# Patient Record
Sex: Female | Born: 1965 | Race: White | Hispanic: No | Marital: Married | Smoking: Former smoker
Health system: Southern US, Community
[De-identification: ages and names within clinical notes are randomized; demographics above are authoritative.]

## PROBLEM LIST (undated history)

## (undated) DIAGNOSIS — F32A Depression, unspecified: Secondary | ICD-10-CM

## (undated) DIAGNOSIS — F329 Major depressive disorder, single episode, unspecified: Secondary | ICD-10-CM

## (undated) DIAGNOSIS — F419 Anxiety disorder, unspecified: Secondary | ICD-10-CM

## (undated) DIAGNOSIS — G43909 Migraine, unspecified, not intractable, without status migrainosus: Secondary | ICD-10-CM

## (undated) HISTORY — PX: CHOLECYSTECTOMY: SHX55

---

## 2005-04-25 ENCOUNTER — Emergency Department: Payer: Self-pay | Admitting: Emergency Medicine

## 2005-04-27 ENCOUNTER — Emergency Department: Payer: Self-pay | Admitting: Emergency Medicine

## 2005-04-27 ENCOUNTER — Other Ambulatory Visit: Payer: Self-pay

## 2007-06-08 ENCOUNTER — Ambulatory Visit: Payer: Self-pay | Admitting: Internal Medicine

## 2009-12-02 ENCOUNTER — Ambulatory Visit: Payer: Self-pay | Admitting: Family Medicine

## 2012-03-08 ENCOUNTER — Emergency Department: Payer: Self-pay | Admitting: Emergency Medicine

## 2012-03-08 LAB — COMPREHENSIVE METABOLIC PANEL
Alkaline Phosphatase: 69 U/L (ref 50–136)
Anion Gap: 6 — ABNORMAL LOW (ref 7–16)
Bilirubin,Total: 0.4 mg/dL (ref 0.2–1.0)
Calcium, Total: 9.4 mg/dL (ref 8.5–10.1)
Co2: 25 mmol/L (ref 21–32)
Glucose: 100 mg/dL — ABNORMAL HIGH (ref 65–99)
Osmolality: 273 (ref 275–301)
SGOT(AST): 15 U/L (ref 15–37)
SGPT (ALT): 28 U/L (ref 12–78)
Sodium: 137 mmol/L (ref 136–145)
Total Protein: 7.8 g/dL (ref 6.4–8.2)

## 2012-03-08 LAB — CBC
HCT: 41.9 % (ref 35.0–47.0)
HGB: 14.5 g/dL (ref 12.0–16.0)
MCH: 28.7 pg (ref 26.0–34.0)
MCHC: 34.6 g/dL (ref 32.0–36.0)
MCV: 83 fL (ref 80–100)
RDW: 13.3 % (ref 11.5–14.5)
WBC: 12.2 10*3/uL — ABNORMAL HIGH (ref 3.6–11.0)

## 2014-05-10 ENCOUNTER — Ambulatory Visit: Payer: Self-pay | Admitting: Family Medicine

## 2015-04-06 ENCOUNTER — Ambulatory Visit
Admission: EM | Admit: 2015-04-06 | Discharge: 2015-04-06 | Disposition: A | Discharge status: Home / Self Care | Payer: 59 | Attending: Family Medicine | Admitting: Family Medicine

## 2015-04-06 ENCOUNTER — Encounter: Payer: Self-pay | Admitting: Emergency Medicine

## 2015-04-06 DIAGNOSIS — J0100 Acute maxillary sinusitis, unspecified: Secondary | ICD-10-CM | POA: Diagnosis not present

## 2015-04-06 DIAGNOSIS — J040 Acute laryngitis: Secondary | ICD-10-CM | POA: Diagnosis not present

## 2015-04-06 HISTORY — DX: Migraine, unspecified, not intractable, without status migrainosus: G43.909

## 2015-04-06 HISTORY — DX: Anxiety disorder, unspecified: F41.9

## 2015-04-06 HISTORY — DX: Major depressive disorder, single episode, unspecified: F32.9

## 2015-04-06 HISTORY — DX: Depression, unspecified: F32.A

## 2015-04-06 MED ORDER — CEFUROXIME AXETIL 500 MG PO TABS: 500.0000 mg | ORAL_TABLET | Freq: Two times a day (BID) | ORAL | Status: AC

## 2015-04-06 MED ORDER — FEXOFENADINE-PSEUDOEPHED ER 180-240 MG PO TB24: 1.0000 | ORAL_TABLET | Freq: Every day | ORAL | Status: AC

## 2015-04-06 MED ORDER — FLUTICASONE PROPIONATE 50 MCG/ACT NA SUSP: 2.0000 | Freq: Every day | NASAL | Status: AC

## 2015-04-06 NOTE — ED Notes (Signed)
Patient c/o sinus pain and pressure, cough, runny nose and nasal congestion since Monday.

## 2015-04-06 NOTE — Discharge Instructions (Signed)
Laryngitis Laryngitis is swelling (inflammation) of your vocal cords. This causes hoarseness, coughing, loss of voice, sore throat, or a dry throat. When your vocal cords are inflamed, your voice sounds different. Laryngitis can be temporary (acute) or long-term (chronic). Most cases of acute laryngitis improve with time. Chronic laryngitis is laryngitis that lasts for more than three weeks. HOME CARE  Drink enough fluid to keep your pee (urine) clear or pale yellow.  Breathe in moist air. Use a humidifier if you live in a dry climate.  Take medicines only as told by your doctor.  Do not smoke cigarettes or electronic cigarettes. If you need help quitting, ask your doctor.  Talk as little as possible. Also avoid whispering, which can cause vocal strain.  Write instead of talking. Do this until your voice is back to normal. GET HELP IF:  You have a fever.  Your pain is worse.  You have trouble swallowing. GET HELP RIGHT AWAY IF:  You cough up blood.  You have trouble breathing.   This information is not intended to replace advice given to you by your health care provider. Make sure you discuss any questions you have with your health care provider.   Document Released: 03/14/2011 Document Revised: 04/15/2014 Document Reviewed: 09/07/2013 Elsevier Interactive Patient Education 2016 Elsevier Inc.  Sinus Rinse WHAT IS A SINUS RINSE? A sinus rinse is a home treatment. It rinses your sinuses with a mixture of salt and water (saline solution). Sinuses are air-filled spaces in your skull behind the bones of your face and forehead. They open into your nasal cavity. To do a sinus rinse, you will need:  Saline solution.  Neti pot or spray bottle. This releases the saline solution into your nose and through your sinuses. You can buy neti pots and spray bottles at:  Your local pharmacy.  A health food store.  Online. WHEN WOULD I DO A SINUS RINSE?  A sinus rinse can help to clear  your nasal cavity. It can clear:   Mucus.  Dirt.  Dust.  Pollen. You may do a sinus rinse when you have:  A cold.  A virus.  Allergies.  A sinus infection.  A stuffy nose. If you are considering a sinus rinse:  Ask your child's doctor before doing a sinus rinse on your child.  Do not do a sinus rinse if you have had:  Ear or nasal surgery.  An ear infection.  Blocked ears. HOW DO I DO A SINUS RINSE?   Wash your hands.  Disinfect your device using the directions that came with the device.  Dry your device.  Use the solution that comes with your device or one that is sold separately in stores. Follow the mixing directions on the package.  Fill your device with the amount of saline solution as stated in the device instructions.  Stand over a sink and tilt your head sideways over the sink.  Place the spout of the device in your upper nostril (the one closer to the ceiling).  Gently pour or squeeze the saline solution into the nasal cavity. The liquid should drain to the lower nostril if you are not too congested.  Gently blow your nose. Blowing too hard may cause ear pain.  Repeat in the other nostril.  Clean and rinse your device with clean water.  Air-dry your device. ARE THERE RISKS OF A SINUS RINSE?  Sinus rinse is normally very safe and helpful. However, there are a few risks, which include:  A burning feeling in the sinuses. This may happen if you do not make the saline solution as instructed. Make sure to follow all directions when making the saline solution.  Infection from unclean water. This is rare, but possible.  Nasal irritation.   This information is not intended to replace advice given to you by your health care provider. Make sure you discuss any questions you have with your health care provider.   Document Released: 10/20/2013 Document Reviewed: 10/20/2013 Elsevier Interactive Patient Education [employer]2016 Elsevier Inc.

## 2015-04-06 NOTE — ED Provider Notes (Addendum)
CSN: [id number]     Arrival date & time 04/06/15  1137 History   First MD Initiated Contact with Patient 04/06/15 1258    Nurses notes were reviewed Chief Complaint  Patient presents with  . Facial Pain  . Nasal Congestion   Patient's been sick for about 5-6 days with appears be a progressing and aggressive sinus infection. She reports greenish drainage from both nostrils initially slight sore throat and sinus congestion with sinus congestion has gotten a lot worse now facial pain she's also become hoarse and she states that the last 3 or 4 days this is the best her voice is been. She states that along with hoarseness she's had the congestion and starting to cough as well.  (Consider location/radiation/quality/duration/timing/severity/associated sxs/prior Treatment) Patient is a 49 y.o. female presenting with URI. The history is provided by the patient. No language interpreter was used.  URI Presenting symptoms: congestion, cough, ear pain, rhinorrhea and sore throat   Congestion:    Location:  Nasal   Interferes with sleep: no   Cough:    Cough characteristics:  Non-productive and hacking   Severity:  Moderate Severity:  Mild Progression:  Worsening Chronicity:  New Worsened by:  Nothing tried Associated symptoms: sinus pain     Past Medical History  Diagnosis Date  . Anxiety   . Depression   . Migraine    Past Surgical History  Procedure Laterality Date  . Cholecystectomy     History reviewed. No pertinent family history. Social History  Substance Use Topics  . Smoking status: Former Games developer  . Smokeless tobacco: None  . Alcohol Use: No   OB History    No data available     Review of Systems  HENT: Positive for congestion, ear pain, rhinorrhea and sore throat.   Respiratory: Positive for cough.   All other systems reviewed and are negative.   Allergies  Penicillins and Influenza vaccines  Home Medications   Prior to Admission medications   Medication Sig  Start Date End Date Taking? Authorizing Provider  busPIRone (BUSPAR) 5 MG tablet Take 5 mg by mouth daily.   Yes Historical Provider, MD  clonazePAM (KLONOPIN) 1 MG tablet Take 1 mg by mouth as needed for anxiety.   Yes Historical Provider, MD  escitalopram (LEXAPRO) 5 MG tablet Take 5 mg by mouth daily.   Yes Historical Provider, MD  levonorgestrel-ethinyl estradiol (LUTERA) 0.1-20 MG-MCG tablet Take 1 tablet by mouth daily.   Yes Historical Provider, MD  cefUROXime (CEFTIN) 500 MG tablet Take 1 tablet (500 mg total) by mouth 2 (two) times daily. 04/06/15   [name], MD  fexofenadine-pseudoephedrine (ALLEGRA-D ALLERGY & CONGESTION) 180-240 MG 24 hr tablet Take 1 tablet by mouth daily. 04/06/15   [name], MD  fluticasone (FLONASE) 50 MCG/ACT nasal spray Place 2 sprays into both nostrils daily. 04/06/15   [name], MD   Meds Ordered and Administered this Visit  Medications - No data to display  BP 133/92 mmHg  Pulse 92  Temp(Src) 98.9 F (37.2 C) (Tympanic)  Resp 16  Ht  (1.6 m)  Wt 195 lb (88.451 kg)  BMI 34.55 kg/m2  SpO2 99% No data found.   Physical Exam  Constitutional: She is oriented to person, place, and time. She appears well-developed and well-nourished.  HENT:  Head: Normocephalic and atraumatic.  Right Ear: Hearing, external ear and ear canal normal. Tympanic membrane is bulging. Tympanic membrane is not erythematous.  Left Ear: Hearing, external ear  and ear canal normal. Tympanic membrane is bulging. Tympanic membrane is not erythematous.  Nose: Mucosal edema and rhinorrhea present. Right sinus exhibits maxillary sinus tenderness and frontal sinus tenderness. Left sinus exhibits maxillary sinus tenderness.  Mouth/Throat: Uvula is midline and mucous membranes are normal. Mucous membranes are not pale. No oral lesions. No dental caries. No oropharyngeal exudate.  Eyes: Pupils are equal, round, and reactive to light.  Neck: Neck supple.  Cardiovascular:  Normal rate, regular rhythm and normal heart sounds.   Pulmonary/Chest: Effort normal and breath sounds normal. No respiratory distress.  Musculoskeletal: Normal range of motion. She exhibits no edema.  Lymphadenopathy:    She has cervical adenopathy.  Neurological: She is alert and oriented to person, place, and time.  Skin: Skin is warm. No erythema.  Psychiatric: She has a normal mood and affect. Her behavior is normal.  Vitals reviewed.   ED Course  Procedures (including critical care time)  Labs Review Labs Reviewed - No data to display  Imaging Review No results found.   Visual Acuity Review  Right Eye Distance:   Left Eye Distance:   Bilateral Distance:    Right Eye Near:   Left Eye Near:    Bilateral Near:         MDM   1. Acute maxillary sinusitis, recurrence not specified   2. Laryngitis, acute    Patient is allergic to penicillin. Since she's had progression of her sinus infection will place on Ceftin 500 one tablet twice a day Allegra-D 24 hours 1 tablet daily and Flonase nasal spray 2 puffs each nostril daily. Work note written for today and tomorrow and go see PCP if not better in about 2 weeks.    Hassan RowanEugene Sonny Anthes, MD 04/06/15 1357  Hassan RowanEugene Korra Christine, MD 04/06/15 [phone]

## 2016-07-05 ENCOUNTER — Ambulatory Visit
Admission: EM | Admit: 2016-07-05 | Discharge: 2016-07-05 | Disposition: A | Discharge status: Home / Self Care | Payer: 59 | Attending: Family Medicine | Admitting: Family Medicine

## 2016-07-05 ENCOUNTER — Ambulatory Visit (INDEPENDENT_AMBULATORY_CARE_PROVIDER_SITE_OTHER): Payer: 59

## 2016-07-05 ENCOUNTER — Encounter: Payer: Self-pay | Admitting: *Deleted

## 2016-07-05 DIAGNOSIS — S93602A Unspecified sprain of left foot, initial encounter: Secondary | ICD-10-CM

## 2016-07-05 DIAGNOSIS — S93402A Sprain of unspecified ligament of left ankle, initial encounter: Secondary | ICD-10-CM

## 2016-07-05 MED ORDER — HYDROCODONE-ACETAMINOPHEN 5-325 MG PO TABS: ORAL_TABLET | ORAL | 0 refills | Status: DC

## 2016-07-05 NOTE — ED Provider Notes (Signed)
[facility]    CSN: [id number] Arrival date & time: 07/05/16  0902     History   Chief Complaint Chief Complaint  Patient presents with  . Foot Pain    HPI Gail Wells is a 51 y.o. female.   The history is provided by the patient.  Foot Pain  This is a new problem. The current episode started yesterday (states twisted foot and ankle while going down some steps yesterday). The problem occurs constantly. The problem has not changed since onset.Pertinent negatives include no chest pain, no abdominal pain, no headaches and no shortness of breath.    Past Medical History:  Diagnosis Date  . Anxiety   . Depression   . Migraine     There are no active problems to display for this patient.   Past Surgical History:  Procedure Laterality Date  . CHOLECYSTECTOMY      OB History    No data available       Home Medications    Prior to Admission medications   Medication Sig Start Date End Date Taking? Authorizing Provider  busPIRone (BUSPAR) 5 MG tablet Take 5 mg by mouth daily.   Yes Historical Provider, MD  clonazePAM (KLONOPIN) 1 MG tablet Take 1 mg by mouth as needed for anxiety.   Yes Historical Provider, MD  escitalopram (LEXAPRO) 5 MG tablet Take 5 mg by mouth daily.   Yes Historical Provider, MD  leuprolide (LUPRON) 3.75 MG injection Inject 3.75 mg into the muscle once.   Yes Historical Provider, MD  cefUROXime (CEFTIN) 500 MG tablet Take 1 tablet (500 mg total) by mouth 2 (two) times daily. 04/06/15   Gail Rowan, MD  fexofenadine-pseudoephedrine (ALLEGRA-D ALLERGY & CONGESTION) 180-240 MG 24 hr tablet Take 1 tablet by mouth daily. 04/06/15   Gail Rowan, MD  fluticasone (FLONASE) 50 MCG/ACT nasal spray Place 2 sprays into both nostrils daily. 04/06/15   Gail Rowan, MD  HYDROcodone-acetaminophen (NORCO/VICODIN) 5-325 MG tablet 1-2 tabs po qd prn severe pain 07/05/16   Gail Mccallum, MD  levonorgestrel-ethinyl estradiol (LUTERA) 0.1-20 MG-MCG  tablet Take 1 tablet by mouth daily.    Historical Provider, MD    Family History History reviewed. No pertinent family history.  Social History Social History  Substance Use Topics  . Smoking status: Former Games developer  . Smokeless tobacco: Never Used  . Alcohol use No     Allergies   Penicillins and Influenza vaccines   Review of Systems Review of Systems  Respiratory: Negative for shortness of breath.   Cardiovascular: Negative for chest pain.  Gastrointestinal: Negative for abdominal pain.  Neurological: Negative for headaches.     Physical Exam Triage Vital Signs ED Triage Vitals [07/05/16 0911]  Enc Vitals Group     BP (!) 145/92     Pulse Rate 81     Resp 16     Temp 98.2 F (36.8 C)     Temp Source Oral     SpO2 99 %     Weight 198 lb (89.8 kg)     Height  (1.626 m)     Head Circumference      Peak Flow      Pain Score 5     Pain Loc      Pain Edu?      Excl. in GC?    No data found.   Updated Vital Signs BP (!) 145/92 (BP Location: Left Arm)   Pulse 81   Temp  98.2 F (36.8 C) (Oral)   Resp 16   Ht  (1.626 m)   Wt 198 lb (89.8 kg)   SpO2 99%   BMI 33.99 kg/m   Visual Acuity Right Eye Distance:   Left Eye Distance:   Bilateral Distance:    Right Eye Near:   Left Eye Near:    Bilateral Near:     Physical Exam  Constitutional: She appears well-developed and well-nourished. No distress.  Musculoskeletal:       Left ankle: She exhibits swelling (mild). She exhibits normal range of motion, no ecchymosis, no deformity, no laceration and normal pulse. Tenderness. Lateral malleolus and AITFL tenderness found. No medial malleolus, no CF ligament, no posterior TFL, no head of 5th metatarsal and no proximal fibula tenderness found. Achilles tendon normal.       Left foot: There is tenderness, bony tenderness and swelling (mild). There is normal range of motion, normal capillary refill, no crepitus, no deformity and no laceration.  Skin:  She is not diaphoretic.  Nursing note and vitals reviewed.    UC Treatments / Results  Labs (all labs ordered are listed, but only abnormal results are displayed) Labs Reviewed - No data to display  EKG  EKG Interpretation None       Radiology Dg Ankle Complete Left  Result Date: 07/05/2016 CLINICAL DATA:  Left ankle pain after fall down stairs yesterday. EXAM: LEFT ANKLE COMPLETE - 3+ VIEW COMPARISON:  None. FINDINGS: There is no evidence of fracture, dislocation, or joint effusion. There is no evidence of arthropathy or other focal bone abnormality. Soft tissues are unremarkable. IMPRESSION: Normal left ankle. Electronically Signed   By: Gail Wells, M.D.   On: 07/05/2016 09:47   Dg Foot Complete Left  Result Date: 07/05/2016 CLINICAL DATA:  Right foot pain after fall down stairs yesterday. EXAM: LEFT FOOT - COMPLETE 3+ VIEW COMPARISON:  None. FINDINGS: There is no evidence of fracture or dislocation. There is no evidence of arthropathy. Mild spurring of posterior calcaneus is noted. Soft tissues are unremarkable. IMPRESSION: No acute abnormality seen in the left foot. Electronically Signed   By: Gail Wells, M.D.   On: 07/05/2016 10:09    Procedures Procedures (including critical care time)  Medications Ordered in UC Medications - No data to display   Initial Impression / Assessment and Plan / UC Course  I have reviewed the triage vital signs and the nursing notes.  Pertinent labs & imaging results that were available during my care of the patient were reviewed by me and considered in my medical decision making (see chart for details).       Final Clinical Impressions(s) / UC Diagnoses   Final diagnoses:  Sprain of left ankle, unspecified ligament, initial encounter  Foot sprain, left, initial encounter    New Prescriptions Discharge Medication List as of 07/05/2016 10:25 AM    START taking these medications   Details  HYDROcodone-acetaminophen  (NORCO/VICODIN) 5-325 MG tablet 1-2 tabs po qd prn severe pain, Print       1. x-ray results and diagnosis reviewed with patient 2. rx as per orders above; reviewed possible side effects, interactions, risks and benefits  3. Recommend supportive treatment with rest, ice, elevation, otc analgesics 4. Follow-up prn if symptoms worsen or don't improve   Gail Mccallum, MD 07/05/16 [phone]

## 2016-07-05 NOTE — ED Triage Notes (Signed)
Patient injured left foot yesterday on going down a set of steps. No previous injury to left foot / ankle.

## 2017-11-04 ENCOUNTER — Other Ambulatory Visit: Payer: Self-pay

## 2017-11-04 ENCOUNTER — Ambulatory Visit (INDEPENDENT_AMBULATORY_CARE_PROVIDER_SITE_OTHER): Payer: Worker's Compensation

## 2017-11-04 ENCOUNTER — Ambulatory Visit
Admission: EM | Admit: 2017-11-04 | Discharge: 2017-11-04 | Disposition: A | Discharge status: Home / Self Care | Payer: Worker's Compensation | Attending: Family Medicine | Admitting: Family Medicine

## 2017-11-04 DIAGNOSIS — M79632 Pain in left forearm: Secondary | ICD-10-CM | POA: Diagnosis not present

## 2017-11-04 DIAGNOSIS — M79642 Pain in left hand: Secondary | ICD-10-CM | POA: Diagnosis not present

## 2017-11-04 DIAGNOSIS — S5012XA Contusion of left forearm, initial encounter: Secondary | ICD-10-CM | POA: Diagnosis not present

## 2017-11-04 DIAGNOSIS — M25532 Pain in left wrist: Secondary | ICD-10-CM

## 2017-11-04 DIAGNOSIS — S60212A Contusion of left wrist, initial encounter: Secondary | ICD-10-CM | POA: Diagnosis not present

## 2017-11-04 DIAGNOSIS — M79602 Pain in left arm: Secondary | ICD-10-CM | POA: Diagnosis not present

## 2017-11-04 DIAGNOSIS — X500XXA Overexertion from strenuous movement or load, initial encounter: Secondary | ICD-10-CM | POA: Diagnosis not present

## 2017-11-04 MED ORDER — HYDROCODONE-ACETAMINOPHEN 5-325 MG PO TABS
ORAL_TABLET | ORAL | 0 refills | Status: AC
Start: 2017-11-04 — End: ?

## 2017-11-04 NOTE — Discharge Instructions (Addendum)
Rest, ice, elevation Follow up with occupational health provider within 1 week

## 2017-11-04 NOTE — ED Provider Notes (Signed)
[facility]    CSN: [id number] Arrival date & time: 11/04/17  1458     History   Chief Complaint Chief Complaint  Patient presents with  . Work Related Injury  . Arm Pain    left    HPI Gail Wells is a 52 y.o. female.   52 yo female with a c/o left hand, wrist and forearm pain and swelling after injuring it yesterday at work. Patient states she was getting out a heavy safe deposit box when it started to slip and fall. States it impacted when caught it with her wrist/forearm causing pain and swelling.   The history is provided by the patient.    Past Medical History:  Diagnosis Date  . Anxiety   . Depression   . Migraine     There are no active problems to display for this patient.   Past Surgical History:  Procedure Laterality Date  . CHOLECYSTECTOMY      OB History   None      Home Medications    Prior to Admission medications   Medication Sig Start Date End Date Taking? Authorizing Provider  busPIRone (BUSPAR) 5 MG tablet Take 5 mg by mouth daily.   Yes [provider]  clonazePAM (KLONOPIN) 1 MG tablet Take 1 mg by mouth as needed for anxiety.   Yes [provider]  escitalopram (LEXAPRO) 5 MG tablet Take 5 mg by mouth daily.   Yes [provider]  FLUoxetine (PROZAC) 20 MG capsule TAKE 1 CAPSULE(20 MG) BY MOUTH EVERY DAY 10/30/17  Yes [provider]  gabapentin (NEURONTIN) 300 MG capsule  10/30/17  Yes [provider]  medroxyPROGESTERone (PROVERA) 10 MG tablet TK 1 T PO QD 10/29/17  Yes [provider]  zolmitriptan (ZOMIG-ZMT) 5 MG disintegrating tablet TAKE 1 TABLET BY MOUTH AS NEEDED FOR MIGRAINE 10/11/15  Yes [provider]  cefUROXime (CEFTIN) 500 MG tablet Take 1 tablet (500 mg total) by mouth 2 (two) times daily. 04/06/15   Hassan RowanWade, Eugene, MD  fexofenadine-pseudoephedrine (ALLEGRA-D ALLERGY & CONGESTION) 180-240 MG 24 hr tablet Take 1 tablet by mouth daily. 04/06/15    Hassan RowanWade, Eugene, MD  fluticasone (FLONASE) 50 MCG/ACT nasal spray Place 2 sprays into both nostrils daily. 04/06/15   Hassan RowanWade, Eugene, MD  HYDROcodone-acetaminophen (NORCO/VICODIN) 5-325 MG tablet 1-2 tabs po qd prn 11/04/17   Payton Mccallumonty, Nyshawn Gowdy, MD  leuprolide (LUPRON) 3.75 MG injection Inject 3.75 mg into the muscle once.    [provider]  levonorgestrel-ethinyl estradiol (LUTERA) 0.1-20 MG-MCG tablet Take 1 tablet by mouth daily.    [provider]    Family History Family History  Problem Relation Age of Onset  . Migraines Mother   . Prostate cancer Father   . Lymphoma Father   . Heart disease Father     Social History Social History   Tobacco Use  . Smoking status: Former Games developermoker  . Smokeless tobacco: Never Used  Substance Use Topics  . Alcohol use: No  . Drug use: No     Allergies   Penicillins and Influenza vaccines   Review of Systems Review of Systems   Physical Exam Triage Vital Signs ED Triage Vitals  Enc Vitals Group     BP 11/04/17 1540 (!) 145/98     Pulse Rate 11/04/17 1540 73     Resp 11/04/17 1540 18     Temp 11/04/17 1540 98.7 F (37.1 C)     Temp Source 11/04/17 1540  Oral     SpO2 11/04/17 1540 98 %     Weight 11/04/17 1536 200 lb (90.7 kg)     Height 11/04/17 1536 5' 3.5" (1.613 m)     Head Circumference --      Peak Flow --      Pain Score 11/04/17 1536 6     Pain Loc --      Pain Edu? --      Excl. in GC? --    No data found.  Updated Vital Signs BP (!) 145/98 (BP Location: Right Arm)   Pulse 73   Temp 98.7 F (37.1 C) (Oral)   Resp 18   Ht 5' 3.5" (1.613 m)   Wt 200 lb (90.7 kg)   SpO2 98%   BMI 34.87 kg/m   Visual Acuity Right Eye Distance:   Left Eye Distance:   Bilateral Distance:    Right Eye Near:   Left Eye Near:    Bilateral Near:     Physical Exam  Constitutional: She appears well-developed and well-nourished. No distress.  Musculoskeletal:       Left wrist: She exhibits tenderness, bony  tenderness and swelling. She exhibits normal range of motion, no effusion, no crepitus, no deformity and no laceration.       Left forearm: She exhibits tenderness and bony tenderness. She exhibits no swelling, no edema, no deformity and no laceration.       Left hand: She exhibits tenderness and swelling. She exhibits normal range of motion, no bony tenderness, normal two-point discrimination, normal capillary refill, no deformity and no laceration. Normal sensation noted. Normal strength noted.  Skin: She is not diaphoretic.  Nursing note and vitals reviewed.    UC Treatments / Results  Labs (all labs ordered are listed, but only abnormal results are displayed) Labs Reviewed - No data to display  EKG None  Radiology Dg Forearm Left  Result Date: 11/04/2017 CLINICAL DATA:  getting out a safe-deposit box and the door started to slip which she caught the box with her left wrist/forearm. Patient states that she has had pain and swelling in area down to her fingers and up to her elbow. EXAM: LEFT FOREARM - 2 VIEW COMPARISON:  None. FINDINGS: There is no evidence of fracture or other focal bone lesions. Soft tissues are unremarkable. IMPRESSION: Negative. Electronically Signed   By: Corlis Leak M.D.   On: 11/04/2017 16:20   Dg Wrist Complete Left  Result Date: 11/04/2017 CLINICAL DATA:  getting out a safe-deposit box and the door started to slip which she caught the box with her left wrist/forearm. Patient states that she has had pain and swelling in area down to her fingers and up to her elbow. EXAM: LEFT WRIST - COMPLETE 3+ VIEW COMPARISON:  None. FINDINGS: There is no evidence of fracture or dislocation. Normal alignment. Corticated ossicle distal to the ulnar styloid. Spurring and mild degenerative change at the STT articulation and first carpometacarpal articulation. There is no evidence of arthropathy or other focal bone abnormality. Soft tissues are unremarkable. IMPRESSION: 1. Negative for  fracture or other acute finding. 2. 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 Electronically Signed   By: Corlis Leak M.D.   On: 11/04/2017 16:29   Dg Hand Complete Left  Result Date: 11/04/2017 CLINICAL DATA:  getting out a safe-deposit box and the door started to slip which she caught the box with her left wrist/forearm. Patient states that she has had pain and swelling  in area down to her fingers and up to her elbow. EXAM: LEFT HAND - COMPLETE 3+ VIEW COMPARISON:  None. FINDINGS: Degenerative change at the STT and first [facility] articulations. Negative for fracture or dislocation. No other significant osseous degenerative change. Corticated ossicle distal to the ulnar styloid. Regional soft tissues unremarkable. Normal mineralization and alignment. IMPRESSION: 1. Negative for fracture or other acute finding. 2. 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 Electronically Signed   By: Corlis Leak M.D.   On: 11/04/2017 16:24    Procedures Procedures (including critical care time)  Medications Ordered in UC Medications - No data to display  Initial Impression / Assessment and Plan / UC Course  I have reviewed the triage vital signs and the nursing notes.  Pertinent labs & imaging results that were available during my care of the patient were reviewed by me and considered in my medical decision making (see chart for details).      Final Clinical Impressions(s) / UC Diagnoses   Final diagnoses:  Contusion of left forearm, initial encounter  Contusion of left wrist, initial encounter     Discharge Instructions     Rest, ice, elevation Follow up with occupational health provider within 1 week    ED Prescriptions    Medication Sig Dispense Auth. Provider   HYDROcodone-acetaminophen (NORCO/VICODIN) 5-325 MG tablet 1-2 tabs po qd prn 6 tablet Payton Mccallum, MD      1. x-ray results (negative for acute injury) and diagnosis reviewed with patient 2. rx as per orders above; reviewed  possible side effects, interactions, risks and benefits  3. Recommend supportive treatment as above 4. Light duty work restrictions as per work form 5. Follow-up with occupational health provider within in 1week  Controlled Substance Prescriptions [place] Controlled Substance Registry consulted? Not Applicable   Payton Mccallum, MD 11/04/17 [phone]

## 2017-11-04 NOTE — ED Triage Notes (Signed)
Patient states that she was at work yesterday and was getting out a safe-deposit box and the door started to slip which she caught the box with her left wrist/forearm. Patient states that she has had pain and swelling in area down to her fingers and up to her elbow.

## 2019-01-18 IMAGING — CR DG WRIST COMPLETE 3+V*L*
4 series · 4 of 4 positions shown · non-contrast
Comparison: None.

CLINICAL DATA: getting out a safe-deposit box and the door started
to slip which she caught the box with her left wrist/forearm.
Patient states that she has had pain and swelling in area down to
her fingers and up to her elbow.

EXAM:
LEFT WRIST - COMPLETE 3+ VIEW

[wrist pa]
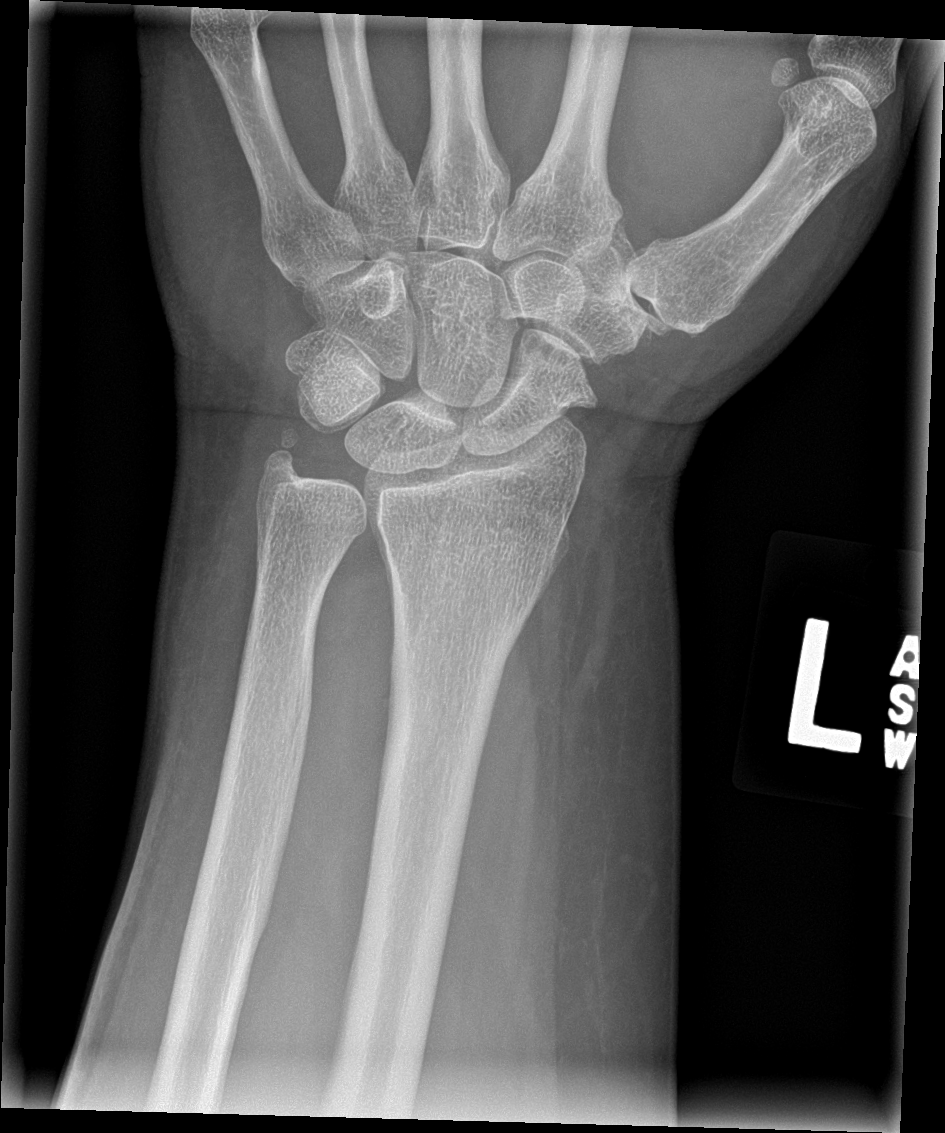

[wrist obl]
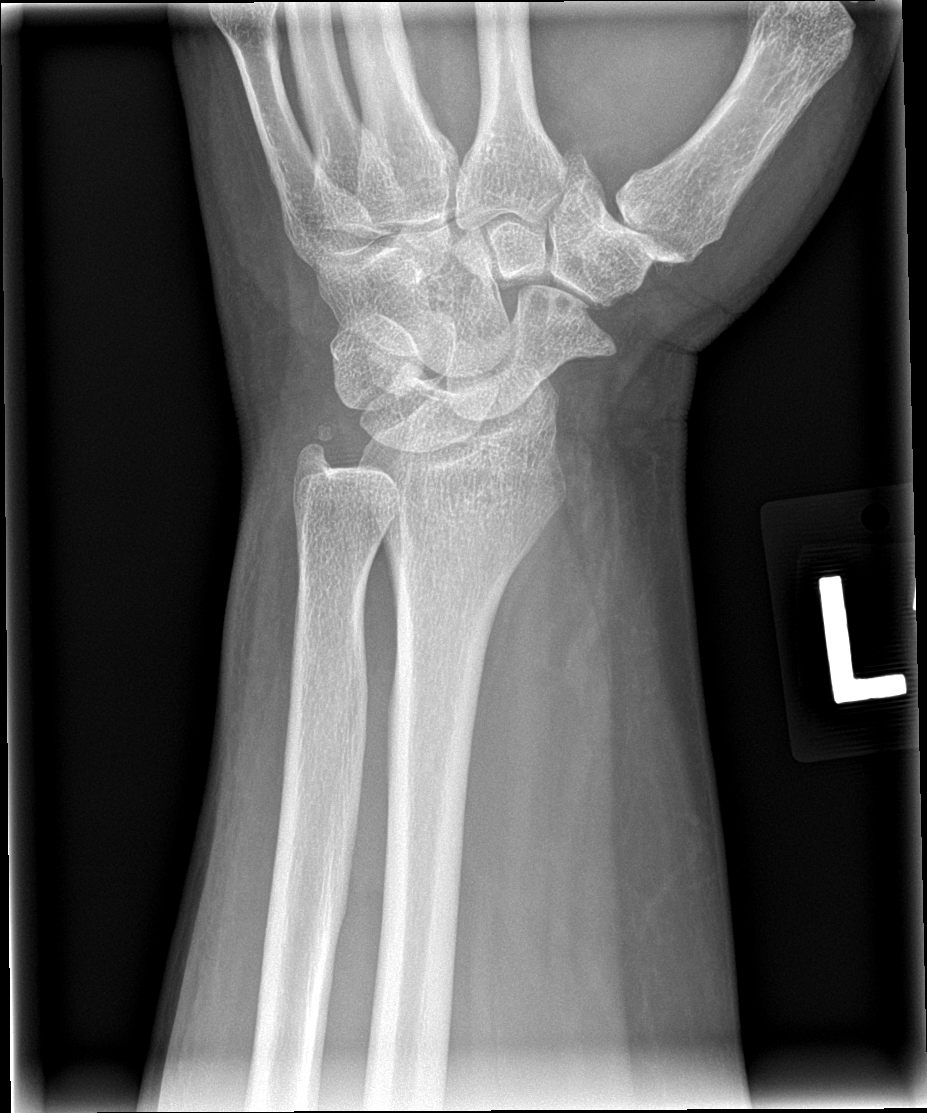

[wrist lat]
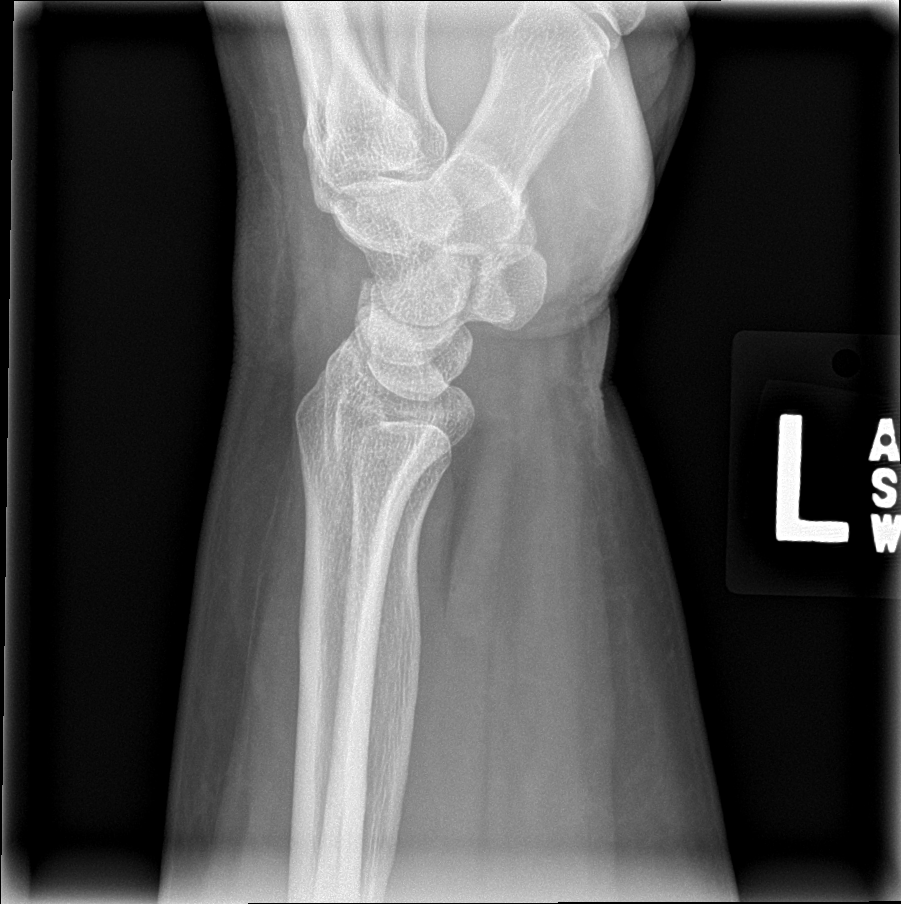

[wrist navicular]
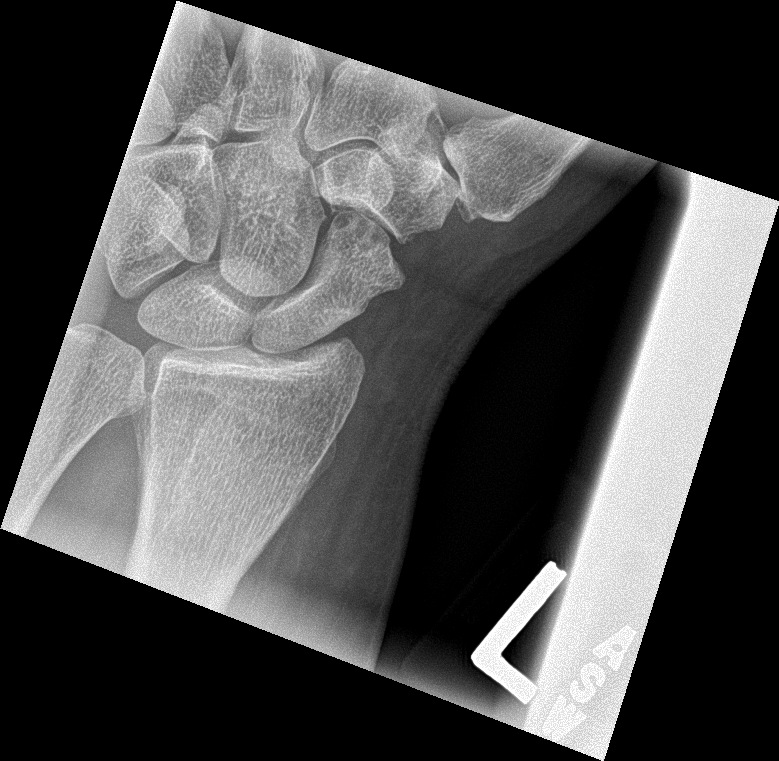

[4 of 4 positions shown; findings below may reference images not displayed]

FINDINGS: There is no evidence of fracture or dislocation. Normal alignment.
Corticated ossicle distal to the ulnar styloid. Spurring and mild
degenerative change at the STT articulation and first
carpometacarpal articulation. There is no evidence of arthropathy or
other focal bone abnormality. Soft tissues are unremarkable.
IMPRESSION: 1. Negative for fracture or other acute finding.
2. DJD at the first CMC and STT articulations.

## 2019-07-03 ENCOUNTER — Ambulatory Visit: Payer: Self-pay | Attending: Internal Medicine

## 2019-07-03 DIAGNOSIS — Z23 Encounter for immunization: Secondary | ICD-10-CM

## 2019-07-03 NOTE — Progress Notes (Signed)
   Covid-19 Vaccination Clinic  Name:  Gail Wells    MRN: [id number] DOB: [birth year]  07/03/2019  [name] was observed post Covid-19 immunization for 15 minutes without incident. She was provided with Vaccine Information Sheet and instruction to access the V-Safe system.   [name] was instructed to call 911 with any severe reactions post vaccine: Marland Kitchen Difficulty breathing  . Swelling of face and throat  . A fast heartbeat  . A bad rash all over body  . Dizziness and weakness   Immunizations Administered    Name Date Dose VIS Date Route   Pfizer COVID-19 Vaccine 07/03/2019  3:11 PM 0.3 mL 03/19/2019 Intramuscular   Manufacturer: [employer], [employer]   Lot: [id number]   NDC: [address]-8

## 2019-07-28 ENCOUNTER — Ambulatory Visit: Payer: Self-pay | Attending: Internal Medicine

## 2019-07-28 DIAGNOSIS — Z23 Encounter for immunization: Secondary | ICD-10-CM

## 2019-07-28 NOTE — Progress Notes (Signed)
   Covid-19 Vaccination Clinic  Name:  Gail Wells    MRN: [id number] DOB: 20-Mar-1966  07/28/2019  [name] was observed post Covid-19 immunization for 15 minutes without incident. She was provided with Vaccine Information Sheet and instruction to access the V-Safe system.   [name] was instructed to call 911 with any severe reactions post vaccine: Marland Kitchen Difficulty breathing  . Swelling of face and throat  . A fast heartbeat  . A bad rash all over body  . Dizziness and weakness   Immunizations Administered    Name Date Dose VIS Date Route   Pfizer COVID-19 Vaccine 07/28/2019  4:11 PM 0.3 mL 06/02/2018 Intramuscular   Manufacturer: [employer], [employer]   Lot: [id number]   NDC: [address]-4
